# Patient Record
Sex: Female | Born: 1975 | Hispanic: Yes | Marital: Single
Health system: Southern US, Community
[De-identification: ages and names within clinical notes are randomized; demographics above are authoritative.]

---

## 2004-11-15 ENCOUNTER — Observation Stay: Payer: Self-pay

## 2004-11-23 ENCOUNTER — Inpatient Hospital Stay: Payer: Self-pay | Admitting: Certified Nurse Midwife

## 2004-12-18 ENCOUNTER — Ambulatory Visit: Payer: Self-pay | Admitting: Emergency Medicine

## 2004-12-18 ENCOUNTER — Emergency Department: Payer: Self-pay | Admitting: Emergency Medicine

## 2004-12-24 ENCOUNTER — Ambulatory Visit: Payer: Self-pay | Admitting: General Surgery

## 2007-11-02 ENCOUNTER — Emergency Department: Payer: Self-pay | Admitting: Emergency Medicine

## 2007-12-11 ENCOUNTER — Ambulatory Visit: Payer: Self-pay | Admitting: Specialist

## 2008-07-11 ENCOUNTER — Emergency Department: Payer: Self-pay | Admitting: Emergency Medicine

## 2008-08-19 IMAGING — CT CT ABD-PELV W/O CM
1 of 2 series · 15 of 32 positions shown, 19 images · non-contrast
Comparison: none

REASON FOR EXAM: right flank pain     hematuria  needs spanish interpreter
COMMENTS:

[Series 2: stone · axial · 0.62mm/px · z∈[+118,+508]mm · 15 of 147 slices shown, 19 images]
[im 11/147  soft-tissue]
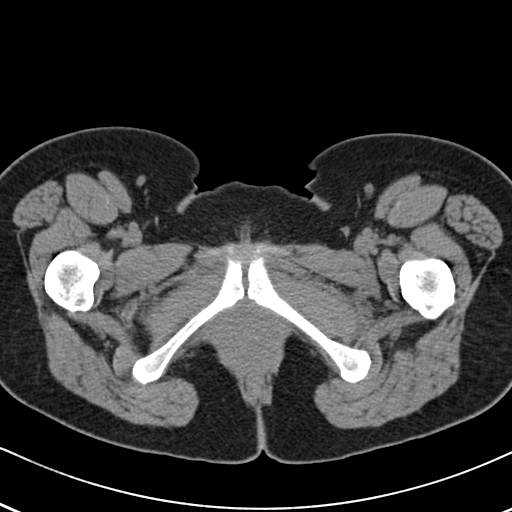
[im 11/147  bone]
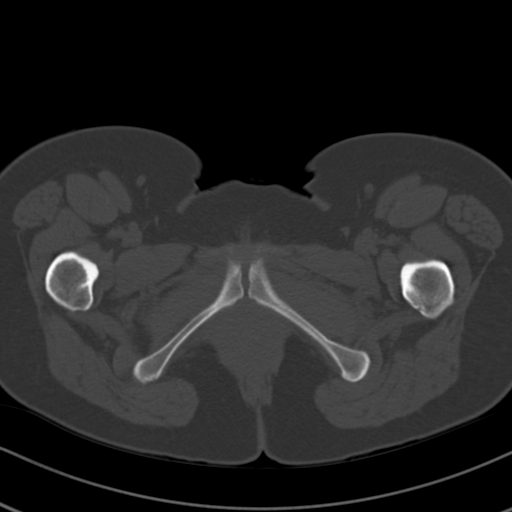
[im 22/147  soft-tissue]
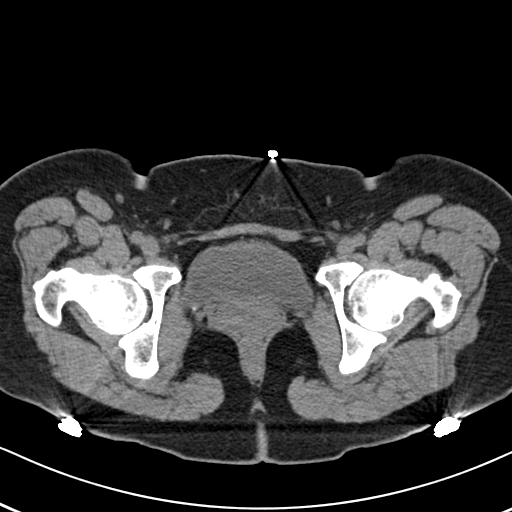
[im 33/147  soft-tissue]
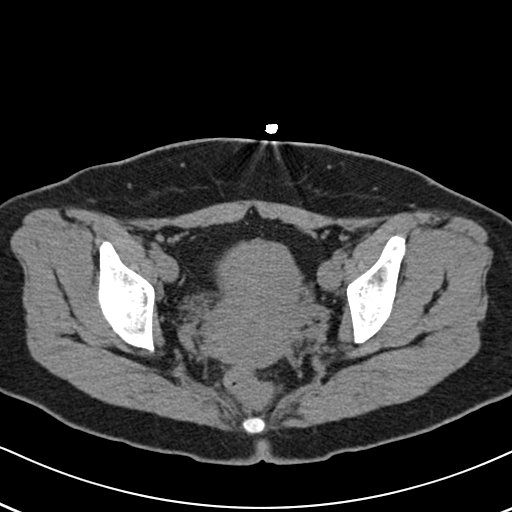
[im 44/147  soft-tissue]
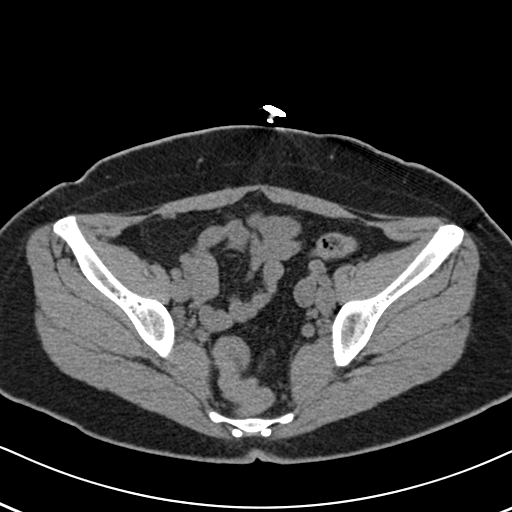
[im 55/147  soft-tissue]
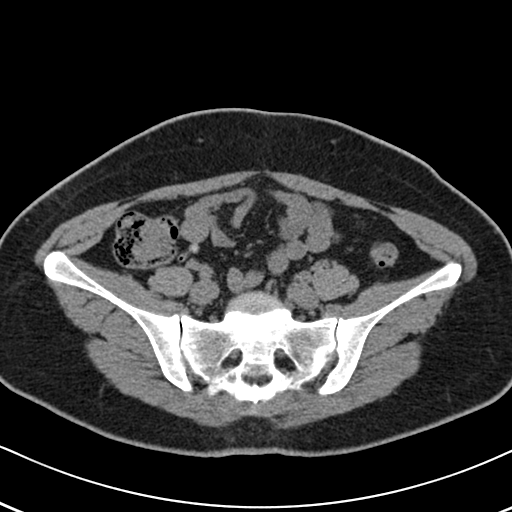
[im 65/147  soft-tissue]
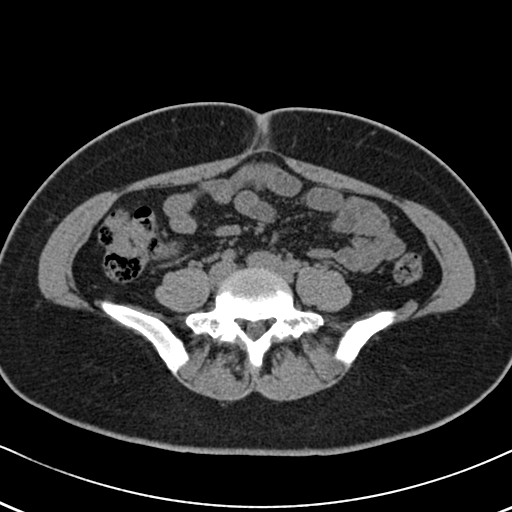
[im 76/147  soft-tissue]
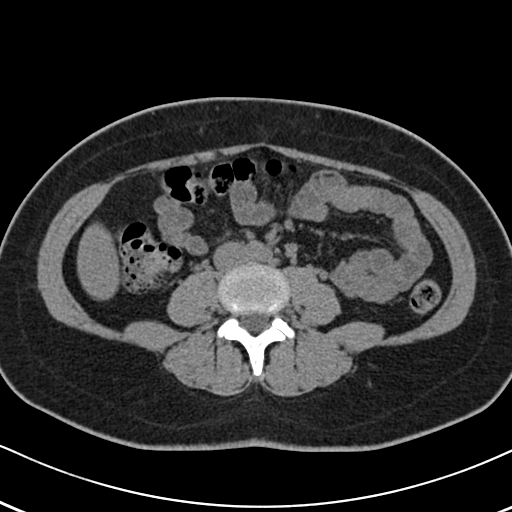
[im 87/147  soft-tissue]
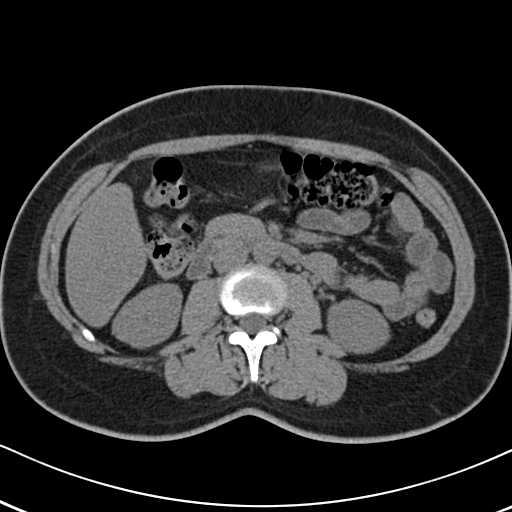
[im 98/147  soft-tissue]
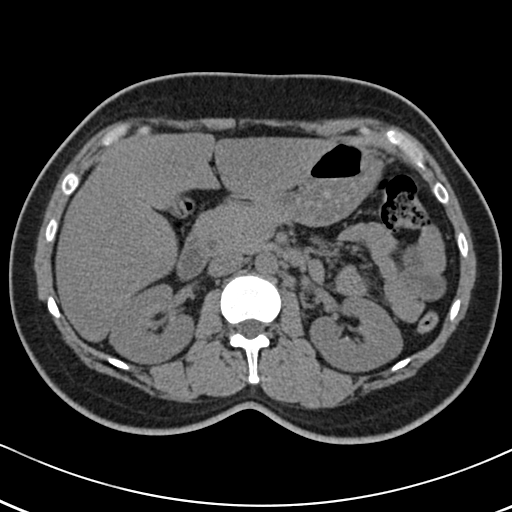
[im 98/147  bone]
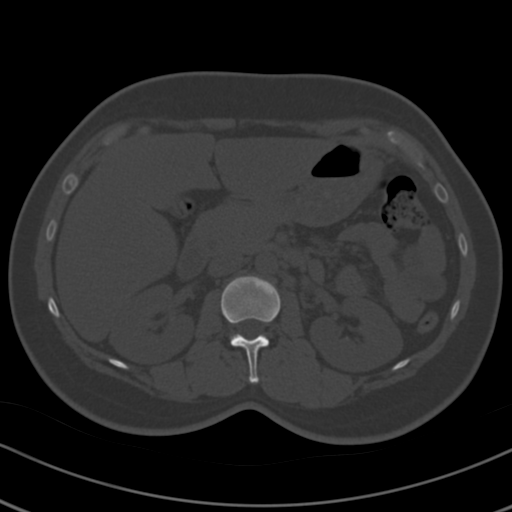
[im 109/147  soft-tissue]
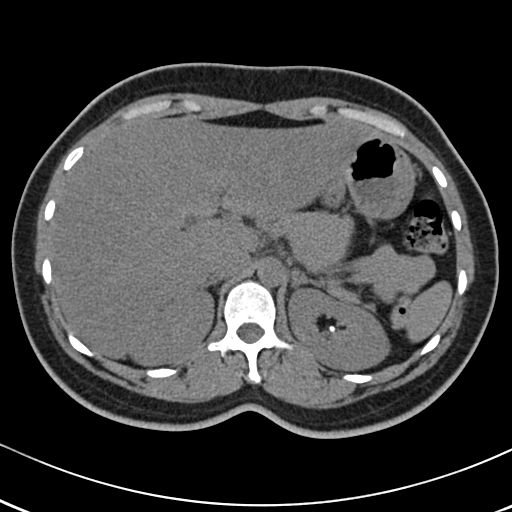
[im 119/147  soft-tissue]
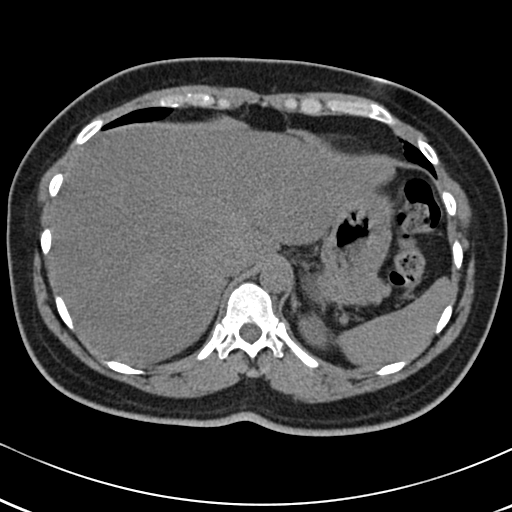
[im 125/147  lung]
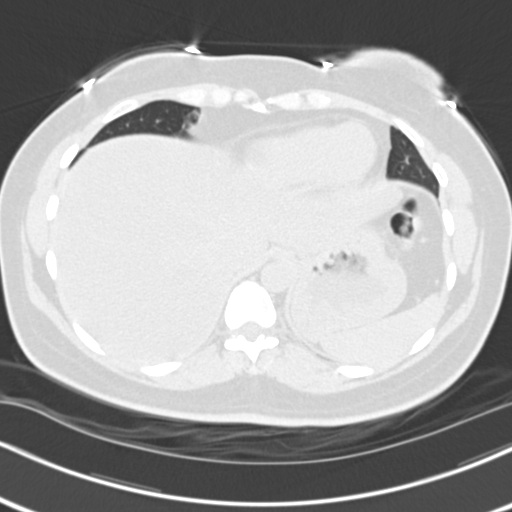
[im 130/147  soft-tissue]
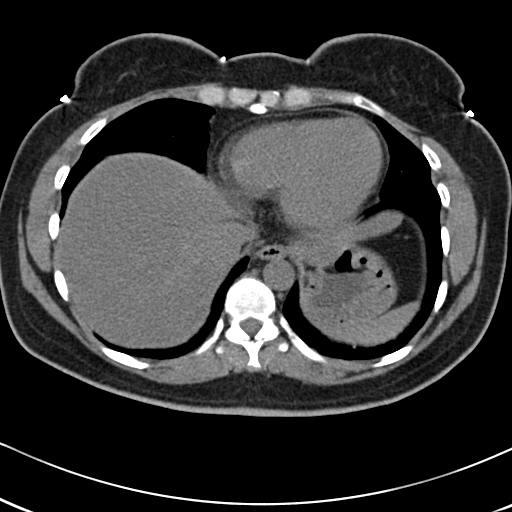
[im 130/147  lung]
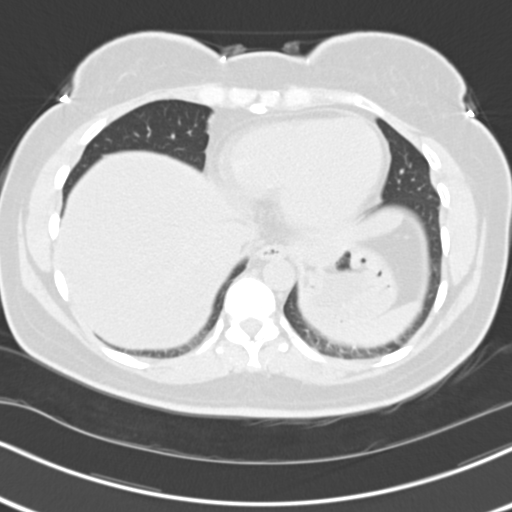
[im 136/147  lung]
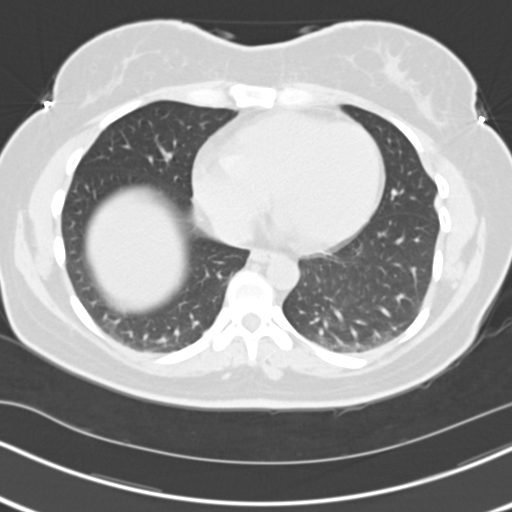
[im 141/147  soft-tissue]
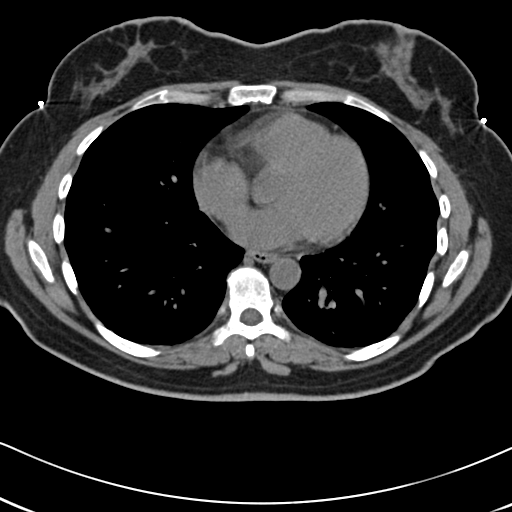
[im 141/147  lung]
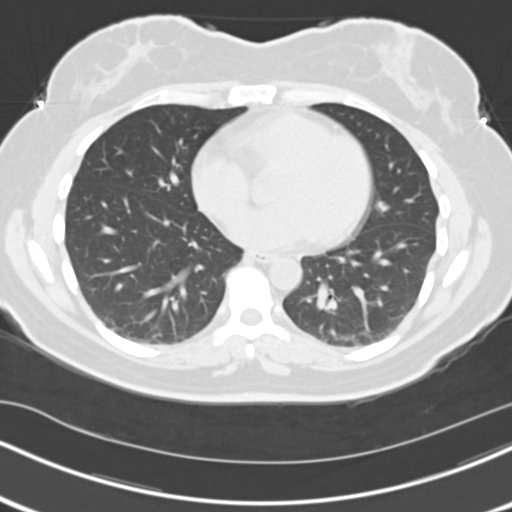

[15 of 32 positions shown; findings below may reference images not displayed]

PROCEDURE:     CT  - CT ABDOMEN AND PELVIS W[DATE]  [DATE]

RESULT:     The patient is undergoing evaluation for RIGHT flank discomfort
and hematuria. Comparison is made to study 02 November, 2007. There are
stones in both kidneys. In the upper pole on the LEFT there is a stone
measuring approximately 3 mm in diameter. On the RIGHT there is a stone in
the upper pole which measures also approximately 3 mm in diameter. There are
two, approximately 2 mm diameter stones in a lower pole calyx on the LEFT
and one on the RIGHT. Neither kidney exhibits hydronephrosis. The
perinephric fat is normal in density. Along the course of the ureters I see
no abnormal calcification. The appendix is demonstrated and contains faintly
radiodense material but there is also air present and no surrounding
inflammatory change is seen. The partially distended urinary bladder is
grossly normal. There is a punctate calcification in the midline along the
bladder base demonstrated best on image #129 which is stable.

The gallbladder is surgically absent. The liver exhibits subtle decreased
density consistent with fatty infiltration. No ductal dilation is suspected.
The spleen, partially distended stomach, pancreas, and adrenal glands are
normal in appearance for the noncontrast study. The caliber of the abdominal
aorta is normal. The unopacified loops of small and large bowel are normal
in appearance. The lung bases are clear.
IMPRESSION: 1.There are stable calcified stones in both kidneys. There is no evidence of
hydronephrosis or hydroureter. A punctate calcification in the midline in
the region of the urinary bladder base appears unchanged but could
conceivably reflect a proximal urethral stone.
2. There are findings that likely reflect fatty infiltration of the liver.
The gallbladder is surgically absent.

## 2009-01-31 ENCOUNTER — Emergency Department: Payer: Self-pay | Admitting: Internal Medicine

## 2009-04-09 ENCOUNTER — Ambulatory Visit: Payer: Self-pay | Admitting: Certified Nurse Midwife

## 2009-09-04 ENCOUNTER — Observation Stay: Payer: Self-pay | Admitting: Obstetrics and Gynecology

## 2009-09-05 ENCOUNTER — Inpatient Hospital Stay: Payer: Self-pay | Admitting: Obstetrics and Gynecology

## 2016-07-01 ENCOUNTER — Other Ambulatory Visit: Payer: Self-pay | Admitting: Primary Care

## 2016-07-01 DIAGNOSIS — Z803 Family history of malignant neoplasm of breast: Secondary | ICD-10-CM

## 2016-08-04 ENCOUNTER — Ambulatory Visit
Admission: RE | Admit: 2016-08-04 | Discharge: 2016-08-04 | Disposition: A | Discharge status: Home / Self Care | Payer: Self-pay | Source: Ambulatory Visit | Attending: Primary Care | Admitting: Primary Care

## 2016-08-04 ENCOUNTER — Encounter: Payer: Self-pay | Admitting: Radiology

## 2016-08-04 DIAGNOSIS — Z803 Family history of malignant neoplasm of breast: Secondary | ICD-10-CM

## 2019-12-11 ENCOUNTER — Other Ambulatory Visit: Payer: Self-pay

## 2019-12-11 ENCOUNTER — Ambulatory Visit: Payer: Self-pay | Attending: Oncology

## 2019-12-11 ENCOUNTER — Ambulatory Visit
Admission: RE | Admit: 2019-12-11 | Discharge: 2019-12-11 | Disposition: A | Discharge status: Home / Self Care | Payer: Self-pay | Source: Ambulatory Visit | Attending: Oncology | Admitting: Oncology

## 2019-12-11 VITALS — BP 138/87 | HR 86 | Temp 98.4°F | Ht 60.0 in | Wt 128.0 lb

## 2019-12-11 DIAGNOSIS — Z0000 Encounter for general adult medical examination without abnormal findings: Secondary | ICD-10-CM

## 2019-12-11 DIAGNOSIS — N63 Unspecified lump in unspecified breast: Secondary | ICD-10-CM

## 2019-12-11 NOTE — Progress Notes (Addendum)
  Subjective:     Patient ID: Kristina Lloyd, female   DOB: 1975-10-17, 44 y.o.   MRN: [id number]  HPI   Review of Systems     Objective:   Physical Exam Chest:     Breasts:        Right: No swelling, bleeding, inverted nipple, mass, nipple discharge, skin change or tenderness.        Left: No swelling, bleeding, inverted nipple, mass, nipple discharge, skin change or tenderness.        Assessment:     44 year old Hispanic patient patient presents for [facility] clinic visit.  Kristina Lloyd from [facility] interpreted exam. Patient screened, and meets [facility] eligibility.  Patient does not have insurance, Medicare or Medicaid. Instructed patient on breast self awareness using teach back method.  Clinical breast exam unremarkable.  No mass or lump palpated.  Mother diagnosed with breast cancer.   Risk Assessment    Risk Scores      12/11/2019   Last edited by: Kristina Like, RN   5-year risk: 0.8 %   Lifetime risk: 13.9 %            Plan:     Sent for bilateral screening mammogram.

## 2019-12-20 ENCOUNTER — Ambulatory Visit
Admission: RE | Admit: 2019-12-20 | Discharge: 2019-12-20 | Disposition: A | Discharge status: Home / Self Care | Payer: Self-pay | Source: Ambulatory Visit | Attending: Oncology | Admitting: Oncology

## 2019-12-20 DIAGNOSIS — N63 Unspecified lump in unspecified breast: Secondary | ICD-10-CM | POA: Insufficient documentation

## 2020-01-01 ENCOUNTER — Other Ambulatory Visit: Payer: Self-pay

## 2020-01-01 DIAGNOSIS — N63 Unspecified lump in unspecified breast: Secondary | ICD-10-CM

## 2020-01-01 NOTE — Progress Notes (Signed)
Birads 3 results on mammogram. radiologist discussed with patient.  Three  month follow-up scheduled for Monday 03/24/20 at 9:20 a.m.  Appointment reminder mailed to patient.

## 2020-01-01 NOTE — Progress Notes (Signed)
Radiologist discussed Birads 3 results with patient.  Patient scheduled for 3 month follow-up.   Appointment reminder mailed to patient.

## 2020-01-07 ENCOUNTER — Other Ambulatory Visit: Payer: Self-pay

## 2020-01-07 ENCOUNTER — Emergency Department: Payer: Self-pay

## 2020-01-07 ENCOUNTER — Encounter: Payer: Self-pay | Admitting: Emergency Medicine

## 2020-01-07 ENCOUNTER — Emergency Department
Admission: EM | Admit: 2020-01-07 | Discharge: 2020-01-07 | Disposition: A | Discharge status: Home / Self Care | Payer: Self-pay | Attending: Emergency Medicine | Admitting: Emergency Medicine

## 2020-01-07 DIAGNOSIS — R319 Hematuria, unspecified: Secondary | ICD-10-CM | POA: Insufficient documentation

## 2020-01-07 DIAGNOSIS — R350 Frequency of micturition: Secondary | ICD-10-CM | POA: Insufficient documentation

## 2020-01-07 DIAGNOSIS — R300 Dysuria: Secondary | ICD-10-CM | POA: Insufficient documentation

## 2020-01-07 DIAGNOSIS — N200 Calculus of kidney: Secondary | ICD-10-CM | POA: Insufficient documentation

## 2020-01-07 DIAGNOSIS — R110 Nausea: Secondary | ICD-10-CM | POA: Insufficient documentation

## 2020-01-07 DIAGNOSIS — N23 Unspecified renal colic: Secondary | ICD-10-CM | POA: Insufficient documentation

## 2020-01-07 LAB — COMPREHENSIVE METABOLIC PANEL
ALT: 26 U/L (ref 0–44)
AST: 25 U/L (ref 15–41)
Albumin: 4.3 g/dL (ref 3.5–5.0)
Alkaline Phosphatase: 69 U/L (ref 38–126)
Anion gap: 12 (ref 5–15)
BUN: 15 mg/dL (ref 6–20)
CO2: 24 mmol/L (ref 22–32)
Calcium: 9.2 mg/dL (ref 8.9–10.3)
Chloride: 102 mmol/L (ref 98–111)
Creatinine, Ser: 0.69 mg/dL (ref 0.44–1.00)
GFR calc Af Amer: >60 mL/min (ref >60)
GFR calc non Af Amer: >60 mL/min (ref >60)
Glucose, Bld: 153 mg/dL — ABNORMAL HIGH (ref 70–99)
Potassium: 3.8 mmol/L (ref 3.5–5.1)
Sodium: 138 mmol/L (ref 135–145)
Total Bilirubin: 0.5 mg/dL (ref 0.3–1.2)
Total Protein: 7.9 g/dL (ref 6.5–8.1)

## 2020-01-07 LAB — URINALYSIS, COMPLETE (UACMP) WITH MICROSCOPIC
Bilirubin Urine: NEGATIVE
Glucose, UA: NEGATIVE mg/dL
Ketones, ur: NEGATIVE mg/dL
Leukocytes,Ua: NEGATIVE
Nitrite: NEGATIVE
Protein, ur: 30 mg/dL — AB
Specific Gravity, Urine: 1.016 (ref 1.005–1.030)
pH: 5 (ref 5.0–8.0)

## 2020-01-07 LAB — CBC
HCT: 36.1 % (ref 36.0–46.0)
Hemoglobin: 12.2 g/dL (ref 12.0–15.0)
MCH: 27 pg (ref 26.0–34.0)
MCHC: 33.8 g/dL (ref 30.0–36.0)
MCV: 79.9 fL — ABNORMAL LOW (ref 80.0–100.0)
Platelets: 358 10*3/uL (ref 150–400)
RBC: 4.52 MIL/uL (ref 3.87–5.11)
RDW: 16.4 % — ABNORMAL HIGH (ref 11.5–15.5)
WBC: 13.6 10*3/uL — ABNORMAL HIGH (ref 4.0–10.5)
nRBC: 0 % (ref 0.0–0.2)

## 2020-01-07 LAB — POCT PREGNANCY, URINE: Preg Test, Ur: NEGATIVE

## 2020-01-07 LAB — LIPASE, BLOOD: Lipase: 38 U/L (ref 11–51)

## 2020-01-07 MED ORDER — IBUPROFEN 400 MG PO TABS
600.0000 mg | ORAL_TABLET | Freq: Once | ORAL | Status: AC
  Administered 2020-01-07: 600 mg via ORAL
  Filled 2020-01-07: qty 2

## 2020-01-07 MED ORDER — TAMSULOSIN HCL 0.4 MG PO CAPS
0.4000 mg | ORAL_CAPSULE | Freq: Once | ORAL | Status: AC
  Administered 2020-01-07: 0.4 mg via ORAL
  Filled 2020-01-07: qty 1

## 2020-01-07 MED ORDER — ONDANSETRON 4 MG PO TBDP
4.0000 mg | ORAL_TABLET | Freq: Once | ORAL | Status: AC
  Administered 2020-01-07: 4 mg via ORAL
  Filled 2020-01-07: qty 1

## 2020-01-07 MED ORDER — CEPHALEXIN 500 MG PO CAPS: 500.0000 mg | ORAL_CAPSULE | Freq: Three times a day (TID) | ORAL | 0 refills | Status: AC

## 2020-01-07 MED ORDER — TAMSULOSIN HCL 0.4 MG PO CAPS: 0.4000 mg | ORAL_CAPSULE | Freq: Every day | ORAL | 0 refills | Status: AC

## 2020-01-07 MED ORDER — OXYCODONE-ACETAMINOPHEN 5-325 MG PO TABS: 1.0000 | ORAL_TABLET | Freq: Four times a day (QID) | ORAL | 0 refills | Status: AC | PRN

## 2020-01-07 MED ORDER — ONDANSETRON 4 MG PO TBDP: 4.0000 mg | ORAL_TABLET | Freq: Three times a day (TID) | ORAL | 0 refills | Status: AC | PRN

## 2020-01-07 MED ORDER — HYDROCODONE-ACETAMINOPHEN 5-325 MG PO TABS
2.0000 | ORAL_TABLET | Freq: Once | ORAL | Status: AC
  Administered 2020-01-07: 2 via ORAL
  Filled 2020-01-07: qty 2

## 2020-01-07 MED ORDER — CEPHALEXIN 500 MG PO CAPS
500.0000 mg | ORAL_CAPSULE | Freq: Once | ORAL | Status: AC
  Administered 2020-01-07: 500 mg via ORAL
  Filled 2020-01-07: qty 1

## 2020-01-07 NOTE — ED Provider Notes (Signed)
[facility] Emergency Department Provider Note  ____________________________________________   First MD Initiated Contact with Patient 01/07/20 1926     (approximate)  I have reviewed the triage vital signs and the nursing notes.   HISTORY  Chief Complaint Abdominal Pain and Dysuria    HPI Kristina Lloyd is a 44 y.o. female  Here with left flank pain. Began 2-3 days ago as fairly acute onset of pain, followed by persistent lower LQ pain with urinary frequency. She has had kidney stones with similar sx.  She has had some nausea with this but no vomiting. She has not taken anything for her pain. Pain is worse with movement, palpation. No specific alleviating or aggravating factors. No known fevers, dysuria, only frequency. She has not seen a Urologist for her stones, but does state she intermittently gets hematuria and passes them spontaneously. She has never needed intervention.       History reviewed. No pertinent past medical history.  There are no problems to display for this patient.   History reviewed. No pertinent surgical history.  Prior to Admission medications   Medication Sig Start Date End Date Taking? Authorizing Provider  cephALEXin (KEFLEX) 500 MG capsule Take 1 capsule (500 mg total) by mouth 3 (three) times daily for 7 days. 01/07/20 01/14/20  Shaune Pollack, MD  ondansetron (ZOFRAN ODT) 4 MG disintegrating tablet Take 1 tablet (4 mg total) by mouth every 8 (eight) hours as needed for nausea or vomiting. 01/07/20   Shaune Pollack, MD  oxyCODONE-acetaminophen (PERCOCET) 5-325 MG tablet Take 1-2 tablets by mouth every 6 (six) hours as needed for moderate pain or severe pain. 01/07/20 01/06/21  Shaune Pollack, MD  tamsulosin (FLOMAX) 0.4 MG CAPS capsule Take 1 capsule (0.4 mg total) by mouth daily after supper for 5 days. 01/07/20 01/12/20  Shaune Pollack, MD    Allergies Patient has no known allergies.  Family History  Problem Relation  Age of Onset  . Breast cancer Mother 51    Social History Social History   Tobacco Use  . Smoking status: Not on file  Substance Use Topics  . Alcohol use: Not on file  . Drug use: Not on file    Review of Systems  Review of Systems  Constitutional: Negative for fatigue and fever.  HENT: Negative for congestion and sore throat.   Eyes: Negative for visual disturbance.  Respiratory: Negative for cough and shortness of breath.   Cardiovascular: Negative for chest pain.  Gastrointestinal: Positive for nausea. Negative for abdominal pain, diarrhea and vomiting.  Genitourinary: Positive for frequency and hematuria. Negative for flank pain.  Musculoskeletal: Negative for back pain and neck pain.  Skin: Negative for rash and wound.  Neurological: Negative for weakness.  All other systems reviewed and are negative.    ____________________________________________  PHYSICAL EXAM:      VITAL SIGNS: ED Triage Vitals  Enc Vitals Group     BP 01/07/20 1605 (!) 142/88     Pulse Rate 01/07/20 1605 88     Resp 01/07/20 1605 18     Temp 01/07/20 1605 98.2 F (36.8 C)     Temp Source 01/07/20 1605 Oral     SpO2 01/07/20 1605 99 %     Weight 01/07/20 1604 130 lb (59 kg)     Height 01/07/20 1604 5\' 1"  (1.549 m)     Head Circumference --      Peak Flow --      Pain Score 01/07/20 1610  10     Pain Loc --      Pain Edu? --      Excl. in [place]? --      Physical Exam Vitals and nursing note reviewed.  Constitutional:      General: She is not in acute distress.    Appearance: She is well-developed.  HENT:     Head: Normocephalic and atraumatic.  Eyes:     Conjunctiva/sclera: Conjunctivae normal.  Cardiovascular:     Rate and Rhythm: Normal rate and regular rhythm.     Heart sounds: Normal heart sounds. No murmur heard.  No friction rub.  Pulmonary:     Effort: Pulmonary effort is normal. No respiratory distress.     Breath sounds: Normal breath sounds. No wheezing or rales.    Abdominal:     General: There is no distension.     Palpations: Abdomen is soft.     Tenderness: There is abdominal tenderness in the periumbilical area and left upper quadrant. There is no guarding or rebound. Negative signs include Murphy's sign and McBurney's sign.     Comments: No CVAT  Musculoskeletal:     Cervical back: Neck supple.  Skin:    General: Skin is warm.     Capillary Refill: Capillary refill takes less than 2 seconds.  Neurological:     Mental Status: She is alert and oriented to person, place, and time.     Motor: No abnormal muscle tone.       ____________________________________________   LABS (all labs ordered are listed, but only abnormal results are displayed)  Labs Reviewed  COMPREHENSIVE METABOLIC PANEL - Abnormal; Notable for the following components:      Result Value   Glucose, Bld 153 (*)    All other components within normal limits  CBC - Abnormal; Notable for the following components:   WBC 13.6 (*)    MCV 79.9 (*)    RDW 16.4 (*)    All other components within normal limits  URINALYSIS, COMPLETE (UACMP) WITH MICROSCOPIC - Abnormal; Notable for the following components:   Color, Urine YELLOW (*)    APPearance HAZY (*)    Hgb urine dipstick MODERATE (*)    Protein, ur 30 (*)    Bacteria, UA RARE (*)    All other components within normal limits  URINE CULTURE  LIPASE, BLOOD  POC URINE PREG, ED  POCT PREGNANCY, URINE    ____________________________________________  EKG: None ________________________________________  RADIOLOGY All imaging, including plain films, CT scans, and ultrasounds, independently reviewed by me, and interpretations confirmed via formal radiology reads.  ED MD interpretation:   CT stone: Left sided 6 mm stone, no severe hydro, otherwise unremarkable  Official radiology report(s): CT Renal Stone Study  Result Date: 01/07/2020 CLINICAL DATA:  Left-sided flank pain EXAM: CT ABDOMEN AND PELVIS WITHOUT CONTRAST  TECHNIQUE: Multidetector CT imaging of the abdomen and pelvis was performed following the standard protocol without IV contrast. COMPARISON:  December 11, 2007 FINDINGS: Lower chest: The lung bases are clear. The heart size is normal. Hepatobiliary: The liver is normal. Status post cholecystectomy.There is no biliary ductal dilation. Pancreas: Normal contours without ductal dilatation. No peripancreatic fluid collection. Spleen: Unremarkable. Adrenals/Urinary Tract: --Adrenal glands: Unremarkable. --Right kidney/ureter: There are multiple small nonobstructing stones in the right kidney. There is no right-sided hydronephrosis. --Left kidney/ureter: There is mild-to-moderate left-sided hydronephrosis to the level of the urinary bladder secondary to a 6 mm stone in the distal left ureter, at the level  of the left UVJ. Additional nonobstructing stones are noted in the left kidney including a large upper pole stone measuring 1.6 cm. --Urinary bladder: Unremarkable. Stomach/Bowel: --Stomach/Duodenum: No hiatal hernia or other gastric abnormality. Normal duodenal course and caliber. --Small bowel: Unremarkable. --Colon: Rectosigmoid diverticulosis without acute inflammation. --Appendix: Normal. Vascular/Lymphatic: Normal course and caliber of the major abdominal vessels. --No retroperitoneal lymphadenopathy. --No mesenteric lymphadenopathy. --No pelvic or inguinal lymphadenopathy. Reproductive: Unremarkable Other: No ascites or free air. The abdominal wall is normal. Musculoskeletal. No acute displaced fractures. IMPRESSION: 1. Left-sided obstructive uropathy secondary to a 6 mm stone in the distal left ureter, at the level of the left UVJ. 2. Bilateral nonobstructing nephrolithiasis. 3. Rectosigmoid diverticulosis without acute inflammation. 4. Status post cholecystectomy. Electronically Signed   By: Katherine Mantle M.D.   On: 01/07/2020 20:07     ____________________________________________  PROCEDURES   Procedure(s) performed (including Critical Care):  Procedures  ____________________________________________  INITIAL IMPRESSION / MDM / ASSESSMENT AND PLAN / ED COURSE  As part of my medical decision making, I reviewed the following data within the electronic MEDICAL RECORD NUMBER Nursing notes reviewed and incorporated, Old chart reviewed, Notes from prior ED visits, and  Controlled Substance Database       *LOTTA FRANKENFIELD was evaluated in Emergency Department on 01/07/2020 for the symptoms described in the history of present illness. She was evaluated in the context of the global COVID-19 pandemic, which necessitated consideration that the patient might be at risk for infection with the SARS-CoV-2 virus that causes COVID-19. Institutional protocols and algorithms that pertain to the evaluation of patients at risk for COVID-19 are in a state of rapid change based on information released by regulatory bodies including the CDC and federal and state organizations. These policies and algorithms were followed during the patient's care in the ED.  Some ED evaluations and interventions may be delayed as a result of limited staffing during the pandemic.*     Medical Decision Making:  44 yo F here with L flank pain. Imaging shows 6 mm stone at left UVJ, with bilateral nephrolithiasis. No significant hydro. She is afebrile, not tachycardic, and has no significant pyuria to suggest infection or complication. Renal function is normal. Mild leukocytosis likely reactive from pain and stress reaction. Discussed management options with pt - she feels much better after PO meds and is tolerating PO. She feels she would like to attempt outpatient management at this time. Encouraged fluids and will send home with analgesics, antiemetics. She does have some mild bacteria in her urine. While she has no pyuria, will cover to be safe to prevent  superimposed infection. Urology referral provided. UCx sent.  ____________________________________________  FINAL CLINICAL IMPRESSION(S) / ED DIAGNOSES  Final diagnoses:  Renal colic  Kidney stone on left side     MEDICATIONS GIVEN DURING THIS VISIT:  Medications  HYDROcodone-acetaminophen (NORCO/VICODIN) 5-325 MG per tablet 2 tablet (2 tablets Oral Given 01/07/20 2007)  ondansetron (ZOFRAN-ODT) disintegrating tablet 4 mg (4 mg Oral Given 01/07/20 2007)  ibuprofen (ADVIL) tablet 600 mg (600 mg Oral Given 01/07/20 2007)  cephALEXin (KEFLEX) capsule 500 mg (500 mg Oral Given 01/07/20 2059)  tamsulosin (FLOMAX) capsule 0.4 mg (0.4 mg Oral Given 01/07/20 2059)     ED Discharge Orders         Ordered    oxyCODONE-acetaminophen (PERCOCET) 5-325 MG tablet  Every 6 hours PRN     Discontinue  Reprint     01/07/20 2037    ondansetron (ZOFRAN ODT)  4 MG disintegrating tablet  Every 8 hours PRN     Discontinue  Reprint     01/07/20 2037    tamsulosin (FLOMAX) 0.4 MG CAPS capsule  Daily after supper     Discontinue  Reprint     01/07/20 2037    cephALEXin (KEFLEX) 500 MG capsule  3 times daily     Discontinue  Reprint     01/07/20 2102           Note:  This document was prepared using Dragon voice recognition software and may include unintentional dictation errors.   Shaune Pollack, MD 01/07/20 2239

## 2020-01-07 NOTE — ED Triage Notes (Signed)
Patient presents to the ED with left lower abdominal pain, dysuria and urinary frequency since yesterday.  Patient denies nausea, vomiting and diarrhea.  Patient appears somewhat uncomfortable in triage.

## 2020-01-07 NOTE — Discharge Instructions (Signed)
Take IBUPROFEN/MOTRIN 600 MG every 8 hours for MILD to MODERATE pain  Drink AT LEAST 8 glasses of water daily  Take the prescribed medications for severe pain and nausea  If pain or nausea persists, call Dr. Lonna Cobb for follow up or return to the ER  RETURN TO THE ER WITH ANY UNCONTROLLED PAIN, NAUSEA, or FEVER

## 2020-01-09 LAB — URINE CULTURE: Culture: NO GROWTH

## 2020-03-24 ENCOUNTER — Ambulatory Visit
Admission: RE | Admit: 2020-03-24 | Discharge: 2020-03-24 | Disposition: A | Discharge status: Home / Self Care | Payer: Self-pay | Source: Ambulatory Visit | Attending: Oncology | Admitting: Oncology

## 2020-03-24 ENCOUNTER — Other Ambulatory Visit: Payer: Self-pay

## 2020-03-24 DIAGNOSIS — N63 Unspecified lump in unspecified breast: Secondary | ICD-10-CM | POA: Insufficient documentation

## 2020-03-27 NOTE — Progress Notes (Signed)
Radiologist reviewed Birads 2 ultrasound findings, and recommendation to return to annual screening.  Copy to [facility].

## 2020-12-01 IMAGING — US US BREAST*R* LIMITED INC AXILLA
1 series · 9 of 9 positions shown · non-contrast
Comparison: Previous exam(s).

CLINICAL DATA: 44-year-old female presenting for 3 month follow-up
after ultrasound evaluation of the right breast demonstrated a
minimally complicated cyst in the subareolar region and prominent
right axillary lymph node after recent COVID vaccine.

EXAM:
ULTRASOUND OF THE RIGHT BREAST

[Series 1: us breast*right* limited inc axilla · 0.05mm/px · 9 of 9 slices shown]
[im 1/9]
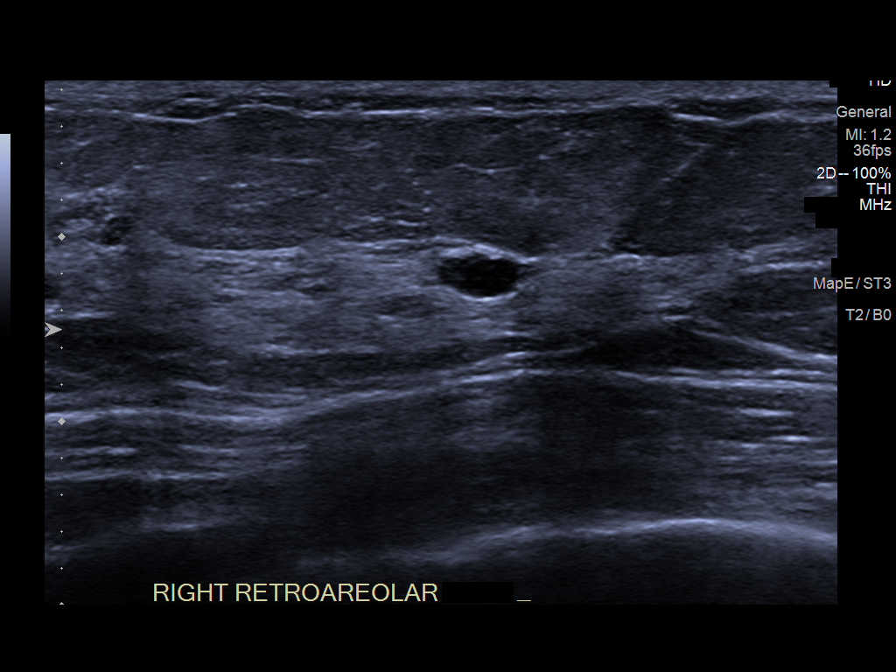
[im 2/9]
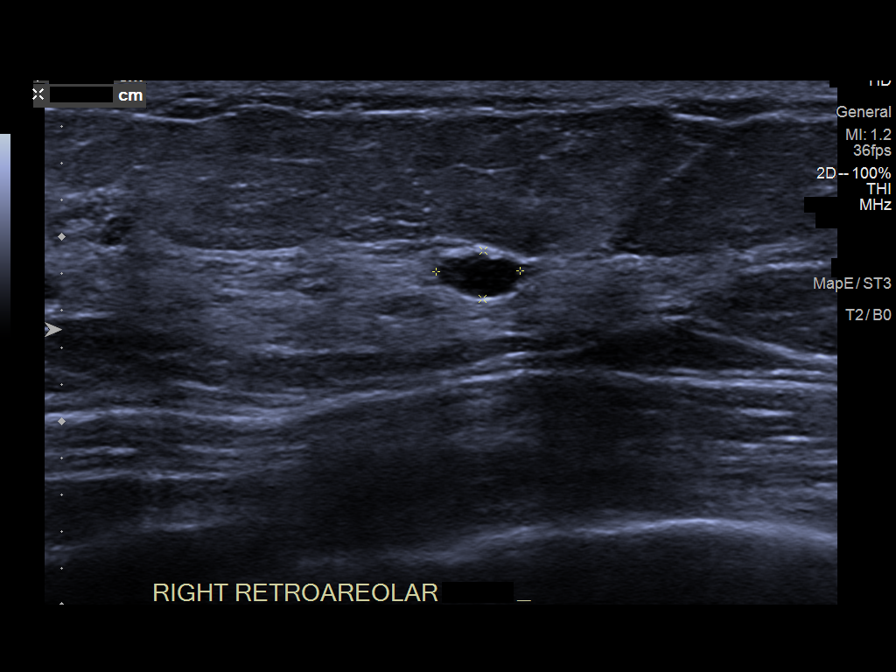
[im 3/9]
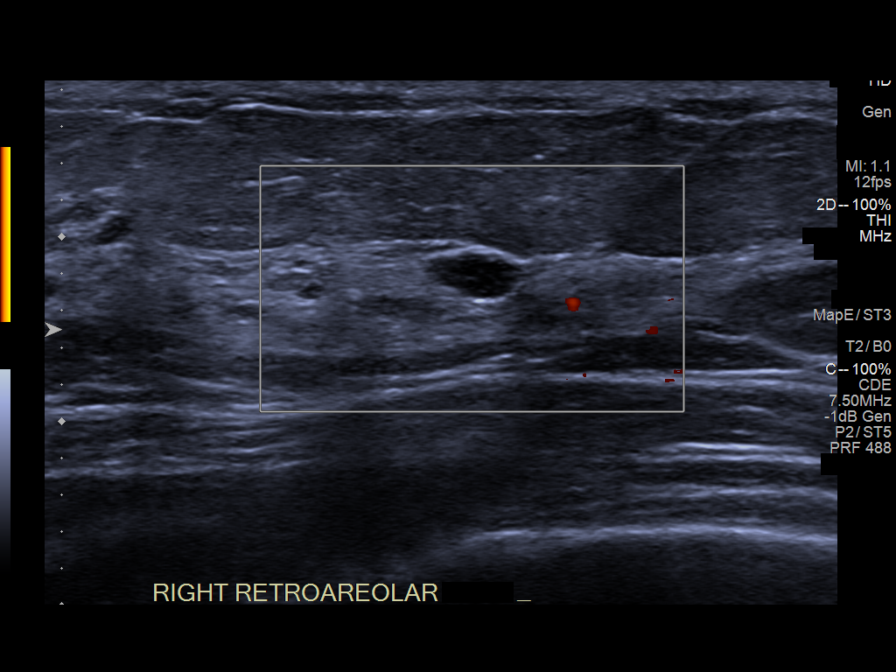
[im 4/9]
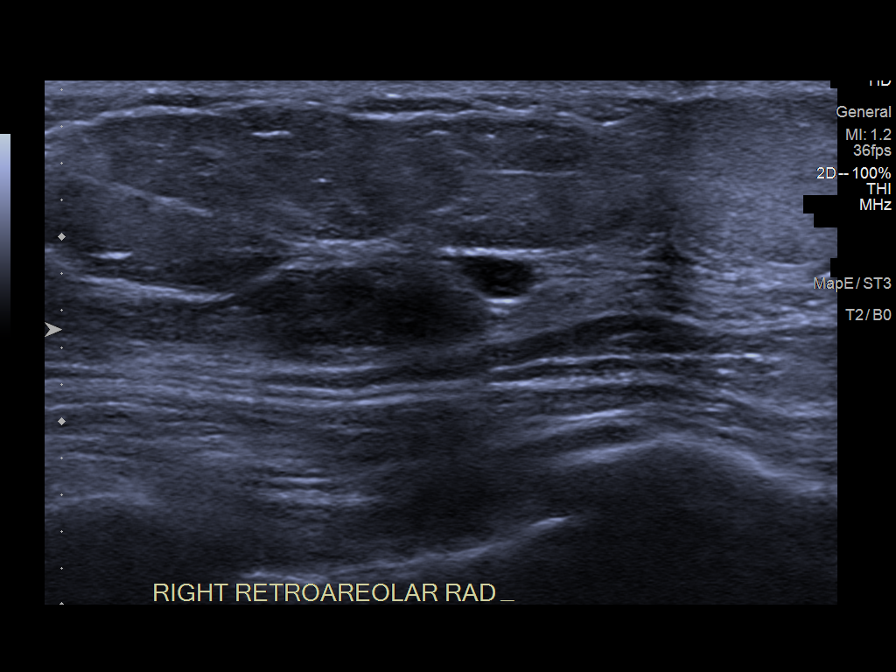
[im 5/9]
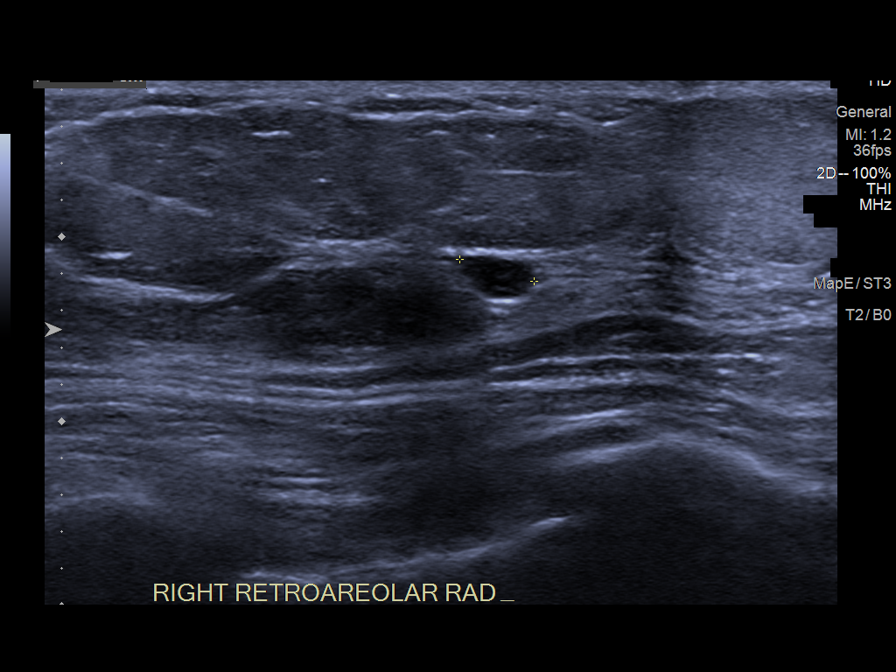
[im 6/9]
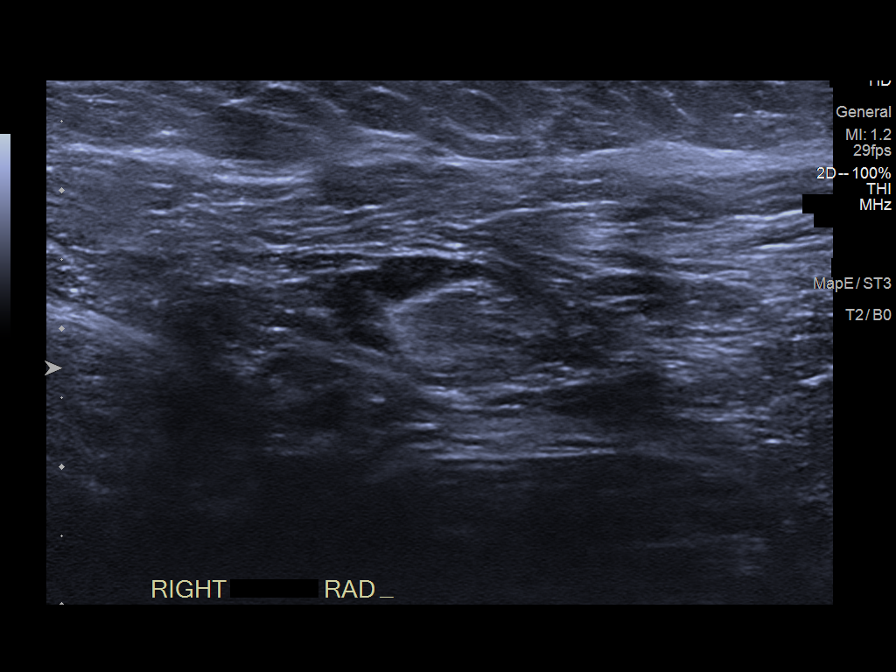
[im 7/9]
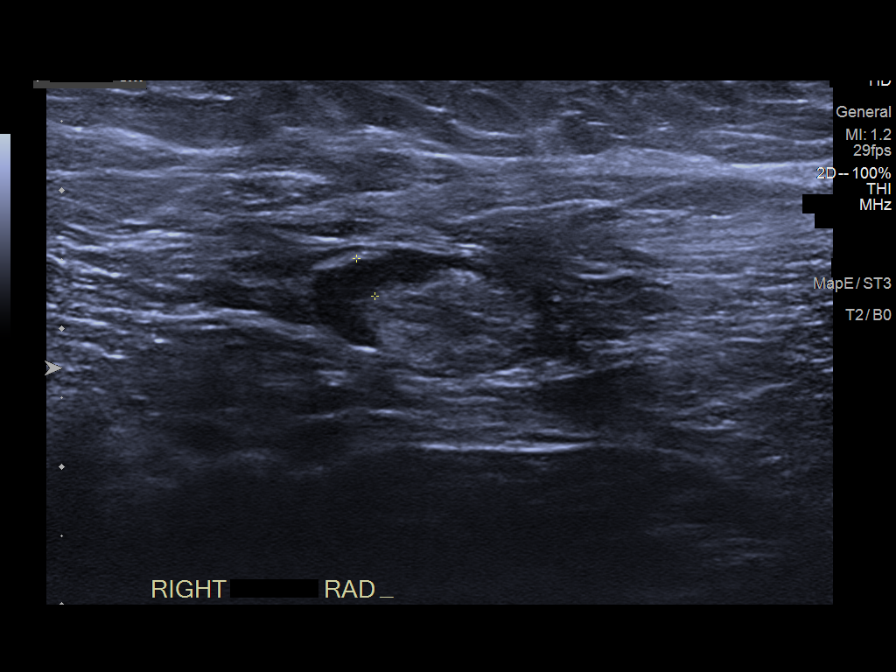
[im 8/9]
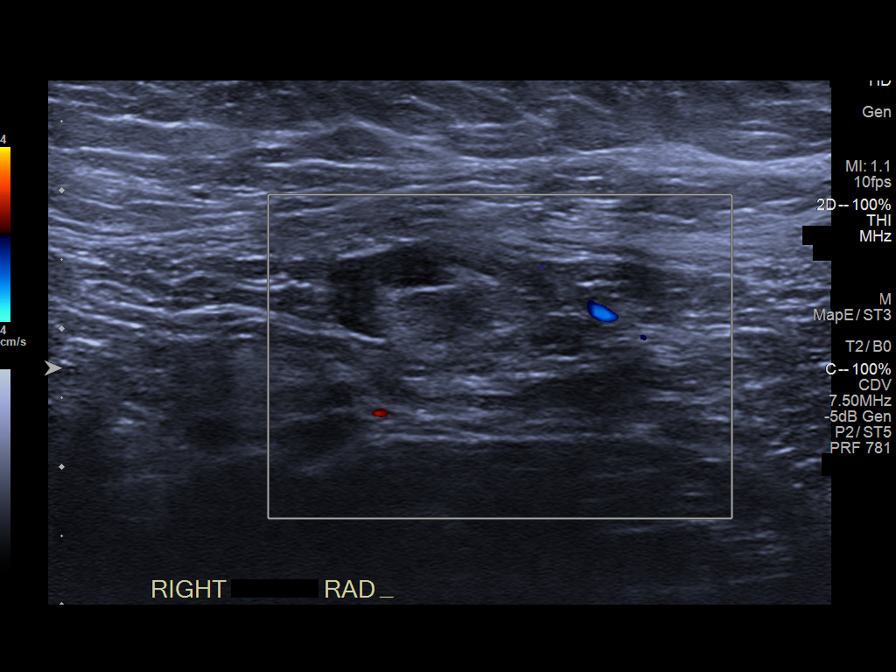
[im 9/9]
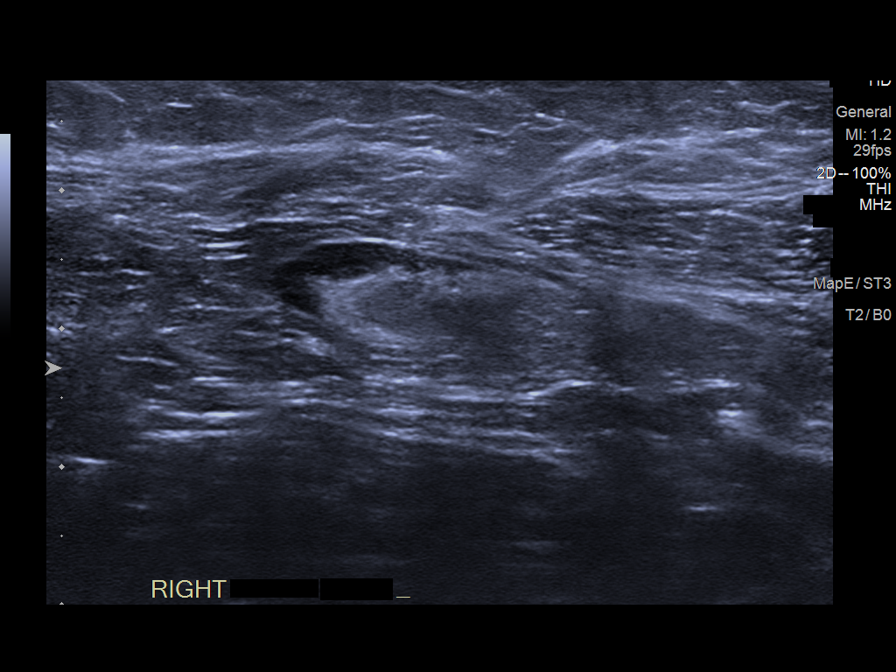

[9 of 9 positions shown; findings below may reference images not displayed]

FINDINGS: Targeted ultrasound is performed, showing increasingly simple
appearance of an oval, circumscribed mass at the retroareolar
position on the right. It measures 5 x 4 x 3 mm (previously 6 x 5 x
3 mm). Other, similar appearing cysts are noted in the region.
Evaluation of the right axilla demonstrates morphologically normal
appearance of the visualized lymph nodes.
IMPRESSION: 1. Morphologically normal right axillary lymph nodes, demonstrating
interval improvement in appearance.
2. Benign right breast retroareolar cyst. No further imaging
follow-up required.

RECOMMENDATION:
Routine annual screening due in December 2020.

I have discussed the findings and recommendations with the patient.
If applicable, a reminder letter will be sent to the patient
regarding the next appointment.

BI-RADS CATEGORY  2: Benign.

## 2023-09-07 ENCOUNTER — Emergency Department
Admission: EM | Admit: 2023-09-07 | Discharge: 2023-09-07 | Disposition: A | Discharge status: Home / Self Care | Payer: Self-pay | Attending: Emergency Medicine | Admitting: Emergency Medicine

## 2023-09-07 ENCOUNTER — Emergency Department: Payer: Self-pay

## 2023-09-07 ENCOUNTER — Other Ambulatory Visit: Payer: Self-pay

## 2023-09-07 DIAGNOSIS — K5730 Diverticulosis of large intestine without perforation or abscess without bleeding: Secondary | ICD-10-CM | POA: Insufficient documentation

## 2023-09-07 DIAGNOSIS — N23 Unspecified renal colic: Secondary | ICD-10-CM | POA: Insufficient documentation

## 2023-09-07 DIAGNOSIS — D72829 Elevated white blood cell count, unspecified: Secondary | ICD-10-CM | POA: Insufficient documentation

## 2023-09-07 DIAGNOSIS — E876 Hypokalemia: Secondary | ICD-10-CM | POA: Insufficient documentation

## 2023-09-07 DIAGNOSIS — K5790 Diverticulosis of intestine, part unspecified, without perforation or abscess without bleeding: Secondary | ICD-10-CM

## 2023-09-07 LAB — URINALYSIS, ROUTINE W REFLEX MICROSCOPIC
Bacteria, UA: NONE SEEN
Bilirubin Urine: NEGATIVE
Glucose, UA: NEGATIVE mg/dL
Ketones, ur: NEGATIVE mg/dL
Nitrite: NEGATIVE
Protein, ur: 100 mg/dL — AB
RBC / HPF: >50 RBC/hpf (ref 0–5)
Specific Gravity, Urine: 1.017 (ref 1.005–1.030)
Squamous Epithelial / HPF: 0 /[HPF] (ref 0–5)
WBC, UA: >50 WBC/hpf (ref 0–5)
pH: 6 (ref 5.0–8.0)

## 2023-09-07 LAB — COMPREHENSIVE METABOLIC PANEL
ALT: 21 U/L (ref 0–44)
AST: 21 U/L (ref 15–41)
Albumin: 3.7 g/dL (ref 3.5–5.0)
Alkaline Phosphatase: 65 U/L (ref 38–126)
Anion gap: 10 (ref 5–15)
BUN: 14 mg/dL (ref 6–20)
CO2: 26 mmol/L (ref 22–32)
Calcium: 8.9 mg/dL (ref 8.9–10.3)
Chloride: 102 mmol/L (ref 98–111)
Creatinine, Ser: 0.63 mg/dL (ref 0.44–1.00)
GFR, Estimated: >60 mL/min (ref >60)
Glucose, Bld: 134 mg/dL — ABNORMAL HIGH (ref 70–99)
Potassium: 3.3 mmol/L — ABNORMAL LOW (ref 3.5–5.1)
Sodium: 138 mmol/L (ref 135–145)
Total Bilirubin: 0.4 mg/dL (ref 0.0–1.2)
Total Protein: 7.4 g/dL (ref 6.5–8.1)

## 2023-09-07 LAB — CBC
HCT: 33.7 % — ABNORMAL LOW (ref 36.0–46.0)
Hemoglobin: 10.6 g/dL — ABNORMAL LOW (ref 12.0–15.0)
MCH: 24.4 pg — ABNORMAL LOW (ref 26.0–34.0)
MCHC: 31.5 g/dL (ref 30.0–36.0)
MCV: 77.5 fL — ABNORMAL LOW (ref 80.0–100.0)
Platelets: 431 10*3/uL — ABNORMAL HIGH (ref 150–400)
RBC: 4.35 MIL/uL (ref 3.87–5.11)
RDW: 18 % — ABNORMAL HIGH (ref 11.5–15.5)
WBC: 11 10*3/uL — ABNORMAL HIGH (ref 4.0–10.5)
nRBC: 0 % (ref 0.0–0.2)

## 2023-09-07 LAB — PREGNANCY, URINE: Preg Test, Ur: NEGATIVE

## 2023-09-07 LAB — LIPASE, BLOOD: Lipase: 48 U/L (ref 11–51)

## 2023-09-07 MED ORDER — SULFAMETHOXAZOLE-TRIMETHOPRIM 800-160 MG PO TABS: 1.0000 | ORAL_TABLET | Freq: Two times a day (BID) | ORAL | 0 refills | Status: AC

## 2023-09-07 MED ORDER — TAMSULOSIN HCL 0.4 MG PO CAPS
0.4000 mg | ORAL_CAPSULE | Freq: Every day | ORAL | 0 refills | Status: AC
Start: 2023-09-07 — End: 2023-09-28

## 2023-09-07 MED ORDER — SULFAMETHOXAZOLE-TRIMETHOPRIM 800-160 MG PO TABS: 1.0000 | ORAL_TABLET | Freq: Two times a day (BID) | ORAL | 0 refills | Status: DC

## 2023-09-07 MED ORDER — ONDANSETRON HCL 4 MG/2ML IJ SOLN
4.0000 mg | Freq: Once | INTRAMUSCULAR | Status: AC
  Administered 2023-09-07: 4 mg via INTRAVENOUS
  Filled 2023-09-07: qty 2

## 2023-09-07 MED ORDER — IBUPROFEN 800 MG PO TABS: 800.0000 mg | ORAL_TABLET | Freq: Three times a day (TID) | ORAL | 0 refills | Status: AC | PRN

## 2023-09-07 MED ORDER — OXYCODONE-ACETAMINOPHEN 5-325 MG PO TABS: 1.0000 | ORAL_TABLET | ORAL | 0 refills | Status: DC | PRN

## 2023-09-07 MED ORDER — SODIUM CHLORIDE 0.9 % IV BOLUS
1000.0000 mL | Freq: Once | INTRAVENOUS | Status: AC
  Administered 2023-09-07: 1000 mL via INTRAVENOUS

## 2023-09-07 MED ORDER — HYDROMORPHONE HCL 1 MG/ML IJ SOLN
0.5000 mg | Freq: Once | INTRAMUSCULAR | Status: AC
  Administered 2023-09-07: 0.5 mg via INTRAVENOUS
  Filled 2023-09-07: qty 0.5

## 2023-09-07 MED ORDER — OXYCODONE-ACETAMINOPHEN 5-325 MG PO TABS: 1.0000 | ORAL_TABLET | ORAL | 0 refills | Status: AC | PRN

## 2023-09-07 NOTE — ED Triage Notes (Signed)
 Pt sts that she has been having upper right abd pain for the last hr.

## 2023-09-07 NOTE — ED Provider Notes (Addendum)
 [facility] Provider Note    Event Date/Time   First MD Initiated Contact with Patient 09/07/23 1920     (approximate)   History   Abdominal Pain   HPI  Kristina Lloyd is a 48 y.o. female who presents today with right lumbar pain that radiates to the left lower quadrant associated with vomit. Patient states pain comes and goes, she feels like contractions.  Patient states having her menstrual period. Patient has history of kidney stones Physical Exam   Triage Vital Signs: ED Triage Vitals [09/07/23 1702]  Encounter Vitals Group     BP (!) 151/99     Systolic BP Percentile      Diastolic BP Percentile      Pulse Rate 84     Resp 18     Temp 98.7 F (37.1 C)     Temp Source Oral     SpO2 100 %     Weight 139 lb (63 kg)     Height 5' (1.524 m)     Head Circumference      Peak Flow      Pain Score 10     Pain Loc      Pain Education      Exclude from Growth Chart     Most recent vital signs: Vitals:   09/07/23 1702  BP: (!) 151/99  Pulse: 84  Resp: 18  Temp: 98.7 F (37.1 C)  SpO2: 100%     Constitutional: Alert moderate distress due to colic Eyes: Conjunctivae are normal.  Head: Atraumatic. Nose: No congestion/rhinnorhea. Mouth/Throat: Mucous membranes are moist.   Neck: Painless ROM.  Cardiovascular:   Good peripheral circulation. Respiratory: Normal respiratory effort.  No retractions.  Gastrointestinal: Skin without scars, bowel sounds positive soft and nontender.  McBurney point negative Rovsing negative rebound negative Murphy negative Musculoskeletal:  no deformity Neurologic:  MAE spontaneously. No gross focal neurologic deficits are appreciated.  Skin:  Skin is warm, dry and intact. No rash noted. Psychiatric: Mood and affect are normal. Speech and behavior are normal.    ED Results / Procedures / Treatments   Labs (all labs ordered are listed, but only abnormal results are displayed) Labs Reviewed   COMPREHENSIVE METABOLIC PANEL - Abnormal; Notable for the following components:      Result Value   Potassium 3.3 (*)    Glucose, Bld 134 (*)    All other components within normal limits  CBC - Abnormal; Notable for the following components:   WBC 11.0 (*)    Hemoglobin 10.6 (*)    HCT 33.7 (*)    MCV 77.5 (*)    MCH 24.4 (*)    RDW 18.0 (*)    Platelets 431 (*)    All other components within normal limits  URINALYSIS, ROUTINE W REFLEX MICROSCOPIC - Abnormal; Notable for the following components:   Color, Urine AMBER (*)    APPearance HAZY (*)    Hgb urine dipstick LARGE (*)    Protein, ur 100 (*)    Leukocytes,Ua SMALL (*)    All other components within normal limits  LIPASE, BLOOD  PREGNANCY, URINE     EKG     RADIOLOGY I independently reviewed and interpreted imaging and agree with radiologists findings.      PROCEDURES:  Critical Care performed:   Procedures   MEDICATIONS ORDERED IN ED: Medications  HYDROmorphone (DILAUDID) injection 0.5 mg (has no administration in time range)  sodium chloride 0.9 %  bolus 1,000 mL (has no administration in time range)  ondansetron (ZOFRAN) injection 4 mg (has no administration in time range)     IMPRESSION / MDM / ASSESSMENT AND PLAN / ED COURSE  I reviewed the triage vital signs and the nursing notes.  Differential diagnosis includes, but is not limited to, renal colic, UTI, muscle strain, appendicitis, ovarian torsion  Patient's presentation is most consistent with acute complicated illness / injury requiring diagnostic workup.   Patient's diagnosis is consistent with renal calculi, colonic diverticulosis.  Hypokalemia, anemia.I independently reviewed and interpreted imaging and agree with radiologists findings. Labs are  reassuring. I did review the patient's allergies and medications. During admission patient received 1000 mL of sodium chloride 0.9%, Zofran IV, Dilaudid .  Reassessed the patient with improvement  of nauseas and pain. The patient is in stable and satisfactory condition for discharge home  Patient will be discharged home with prescriptions for tamsulosin, Percocet, ibuprofen, Bactrim. Patient is to follow up with PCP, urology as needed or otherwise directed. Patient is given ED precautions to return to the ED for any worsening or new symptoms. Discussed plan of care with patient, answered all of patient's questions, Patient agreeable to plan of care. Advised patient to take medications according to the instructions on the label. Discussed possible side effects of new medications. Patient verbalized understanding. Clinical Course as of 09/07/23 2056  Wed Sep 07, 2023  1920 CBC(!) White blood cells elevated 11.0, anemia hemoglobin 10.6.  Platelets increased 431 [AE]  1921 Comprehensive metabolic panel(!) Hypokalemia 3.3.  Sodium, renal function, liver function, anion gap within normal limits [AE]  1921 Lipase, blood Within normal limits [AE]  1921 Urinalysis, Routine w reflex microscopic -Urine, Clean Catch(!) Hemoglobin positive, protein positive, leukocytes positive [AE]  1922 Pregnancy, urine Negative [AE]  2039 CT Renal Stone Study 1. 7 mm obstructing renal calculus within the distal right ureter. 2. Numerous subcentimeter bilateral non-obstructing renal calculi. 3. Colonic diverticulosis. 4. Evidence of prior cholecystectomy.   [AE]    Clinical Course User Index [AE] Gladys Damme, PA-C     FINAL CLINICAL IMPRESSION(S) / ED DIAGNOSES   Final diagnoses:  None     Rx / DC Orders   ED Discharge Orders     None        Note:  This document was prepared using Dragon voice recognition software and may include unintentional dictation errors.   Gladys Damme, PA-C 09/07/23 2108    Gladys Damme, PA-C 09/07/23 2109    Delton Pra[place], MD 09/07/23 [phone]

## 2023-09-07 NOTE — Discharge Instructions (Addendum)
 You have been diagnosed with renal colic, diverticulosis, hypokalemia.  Please take tamsulosin 1 capsule by mouth daily for 21 days.  Please take Percocet 1 tablet by mouth every 4 hours as needed for pain, please take ibuprofen 1 tablet every 8 hours  after main meals for pain.  Please take Bactrim 1 tablet by mouth 2 times daily for 7 days for diverticulosis.  This may appointment in 1 week with your PCP for follow-up.  If you have new symptoms or symptoms worsen please come back to ED or go to your PCP.  Please make an appointment with urology if you have persistent symptoms.  Please drink plenty of fluids please drink water with electrolytes Usted ha sido diagnosticada con colico renal, diverticulosis, hipocalemia( potasio bajo) Por favor tome tamsulosin, 1 tableta diaria por 21 dias, tome percocet cada 4 horas para el dolor si lo necesita. Tome ibuprofeno cada 8 horas encima de las [address], [place] el bactrim,  1 tableta cada 12 horas para la diverticulosis. Por favor regrese a urgencias o vaya a su medico de cabecera si tiene nuevos sintomas o si empeora.  Saque una cita con urologia si no mejora de los calculos.

## 2023-11-01 ENCOUNTER — Ambulatory Visit: Payer: Self-pay | Admitting: Urology

## 2023-11-03 ENCOUNTER — Encounter: Payer: Self-pay | Admitting: Urology
